# Patient Record
Sex: Male | Born: 2000 | Race: White | Hispanic: No | Marital: Married | Smoking: Never smoker
Health system: Southern US, Community
[De-identification: ages and names within clinical notes are randomized; demographics above are authoritative.]

## PROBLEM LIST (undated history)

## (undated) DIAGNOSIS — G8929 Other chronic pain: Secondary | ICD-10-CM

## (undated) DIAGNOSIS — F419 Anxiety disorder, unspecified: Secondary | ICD-10-CM

## (undated) DIAGNOSIS — F329 Major depressive disorder, single episode, unspecified: Secondary | ICD-10-CM

## (undated) DIAGNOSIS — M545 Low back pain: Secondary | ICD-10-CM

## (undated) HISTORY — DX: Anxiety disorder, unspecified: F41.9

## (undated) HISTORY — PX: TONSILLECTOMY: SUR1361

## (undated) HISTORY — DX: Low back pain: M54.5

## (undated) HISTORY — DX: Other chronic pain: G89.29

## (undated) HISTORY — DX: Major depressive disorder, single episode, unspecified: F32.9

---
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
## 2019-02-09 DIAGNOSIS — Z20828 Contact with and (suspected) exposure to other viral communicable diseases: Secondary | ICD-10-CM | POA: Diagnosis not present

## 2019-02-28 DIAGNOSIS — R0981 Nasal congestion: Secondary | ICD-10-CM | POA: Diagnosis not present

## 2019-02-28 DIAGNOSIS — R05 Cough: Secondary | ICD-10-CM | POA: Diagnosis not present

## 2019-02-28 DIAGNOSIS — U071 COVID-19: Secondary | ICD-10-CM | POA: Diagnosis not present

## 2019-02-28 DIAGNOSIS — R509 Fever, unspecified: Secondary | ICD-10-CM | POA: Diagnosis not present

## 2019-03-16 ENCOUNTER — Ambulatory Visit: Payer: BLUE CROSS/BLUE SHIELD | Admitting: Physician Assistant

## 2019-05-29 ENCOUNTER — Encounter: Payer: Self-pay | Admitting: Physician Assistant

## 2019-05-29 NOTE — Telephone Encounter (Signed)
Raymond Leblanc for letter stating he has been off since last fill date in historical meds. Thanks.

## 2019-07-07 ENCOUNTER — Other Ambulatory Visit: Payer: Self-pay | Admitting: Physician Assistant

## 2019-07-07 DIAGNOSIS — G44019 Episodic cluster headache, not intractable: Secondary | ICD-10-CM

## 2019-08-01 ENCOUNTER — Other Ambulatory Visit: Payer: Self-pay | Admitting: Physician Assistant

## 2019-08-01 ENCOUNTER — Other Ambulatory Visit: Payer: Self-pay

## 2019-08-01 DIAGNOSIS — G44019 Episodic cluster headache, not intractable: Secondary | ICD-10-CM

## 2019-08-01 MED ORDER — TOPIRAMATE 50 MG PO TABS: 50.0000 mg | ORAL_TABLET | Freq: Every day | ORAL | 0 refills | Status: AC

## 2019-08-02 ENCOUNTER — Other Ambulatory Visit: Payer: Self-pay

## 2019-08-02 DIAGNOSIS — G44019 Episodic cluster headache, not intractable: Secondary | ICD-10-CM

## 2019-08-02 MED ORDER — AMITRIPTYLINE HCL 25 MG PO TABS: 25.0000 mg | ORAL_TABLET | Freq: Every day | ORAL | 0 refills | Status: DC

## 2019-08-02 NOTE — Telephone Encounter (Signed)
Refilled 90-day supply medication but it has been almost a year since he has seen Raymond Leblanc.  Would encourage him to come in for visit to establish with new PCP

## 2024-07-02 ENCOUNTER — Encounter: Payer: Self-pay | Admitting: Nurse Practitioner

## 2024-07-02 ENCOUNTER — Ambulatory Visit: Admitting: Nurse Practitioner

## 2024-07-02 VITALS — BP 137/83 | HR 109 | Wt 241.6 lb

## 2024-07-02 DIAGNOSIS — F419 Anxiety disorder, unspecified: Secondary | ICD-10-CM

## 2024-07-02 DIAGNOSIS — Z1329 Encounter for screening for other suspected endocrine disorder: Secondary | ICD-10-CM

## 2024-07-02 DIAGNOSIS — Z13220 Encounter for screening for lipoid disorders: Secondary | ICD-10-CM | POA: Diagnosis not present

## 2024-07-02 MED ORDER — HYDROXYZINE HCL 10 MG PO TABS: 10.0000 mg | ORAL_TABLET | Freq: Three times a day (TID) | ORAL | 0 refills | Status: AC | PRN

## 2024-07-02 MED ORDER — FLUOXETINE HCL 20 MG PO CAPS: 20.0000 mg | ORAL_CAPSULE | Freq: Every day | ORAL | 3 refills | Status: AC

## 2024-07-02 NOTE — Progress Notes (Signed)
 Subjective   Patient ID: Raymond Leblanc, male    DOB: [birth year], 23 y.o.   MRN: [id number]  Chief Complaint  Patient presents with   Anxiety    Has been going on for awhile and would like to start medication if possible    Establish Care    Referring provider: No ref. provider found  Bader Stubblefield is a 23 y.o. male with Past Medical History: No date: Anxiety 07/13/2016: Chronic bilateral low back pain without sciatica 07/13/2016: Depression   HPI  Patient presents today to establish care.  Patient states that he does have a history of anxiety and has been having increased anxiety related to his job recently.  He is in therapy.  We discussed that we will trial Prozac  and hydroxyzine . Denies f/c/s, n/v/d, hemoptysis, PND, leg swelling Denies chest pain or edema       Allergies[1]  Immunization History  Administered Date(s) Administered   DTaP 10/16/1999, 12/18/1999, 02/12/2000, 02/10/2001, 08/12/2004   HIB (PRP-OMP) 10/16/1999, 12/18/1999, 02/12/2000, 08/17/2000   HPV 9-valent 06/26/2014, 09/04/2015   Hepatitis A, Ped/Adol-2 Dose 10/14/2006, 03/07/2008   Hepatitis B, PED/ADOLESCENT 2000[birth year]06-25, 12/18/1999, 05/13/2000   IPV 10/16/1999, 12/18/1999, 05/13/2000, 08/12/2004   Influenza Split 05/24/2006, 06/03/2010   Influenza, Seasonal, Injecte, Preservative Fre 06/26/2014, 09/04/2015   Influenza,inj,Quad PF,6+ Mos 09/15/2018, 05/06/2019   Influenza-Unspecified 06/24/2011, 05/03/2012, 04/09/2020   MMR 08/17/2000, 08/12/2004   Meningococcal B Recombinant 09/04/2015   Meningococcal Conjugate 03/17/2011, 09/04/2015   PFIZER(Purple Top)SARS-COV-2 Vaccination 06/15/2020   Pneumococcal Conjugate-13 10/16/1999, 12/18/1999, 02/12/2000, 08/17/2000   Tdap 03/17/2011   Varicella 10/07/2000, 03/07/2008    Tobacco History: Tobacco Use History[2] Counseling given: Not Answered   Outpatient Encounter Medications as of 07/02/2024  Medication Sig   FLUoxetine  (PROZAC )  20 MG capsule Take 1 capsule (20 mg total) by mouth daily.   hydrOXYzine  (ATARAX ) 10 MG tablet Take 1 tablet (10 mg total) by mouth 3 (three) times daily as needed.   omeprazole (PRILOSEC OTC) 20 MG tablet Take 20 mg by mouth daily.   amitriptyline  (ELAVIL ) 25 MG tablet TAKE 1 TABLET BY MOUTH EVERYDAY AT BEDTIME   topiramate  (TOPAMAX ) 50 MG tablet Take 1 tablet (50 mg total) by mouth at bedtime. **PATIENT NEEDS OFFICE VISIT TO ESTABLISH CARE WITH A NEW PROVIDER FOR ADDITIONAL REFILLS**   No facility-administered encounter medications on file as of 07/02/2024.    Review of Systems  Review of Systems  Constitutional: Negative.   HENT: Negative.    Cardiovascular: Negative.   Gastrointestinal: Negative.   Allergic/Immunologic: Negative.   Neurological: Negative.   Psychiatric/Behavioral: Negative.       Objective:   BP 137/83 (BP Location: Left Arm, Patient Position: Sitting, Cuff Size: Large)   Pulse (!) 109   Wt 241 lb 9.6 oz (109.6 kg)   SpO2 98%   BMI 34.67 kg/m   Wt Readings from Last 5 Encounters:  07/02/24 241 lb 9.6 oz (109.6 kg)  09/15/18 213 lb (96.6 kg) (96%, Z= 1.76)*  08/17/18 216 lb (98 kg) (97%, Z= 1.83)*  01/20/18 217 lb (98.4 kg) (97%, Z= 1.89)*  08/31/16 185 lb 9.6 oz (84.2 kg) (92%, Z= 1.40)*   * Growth percentiles are based on CDC (Boys, 2-20 Years) data.     Physical Exam Vitals and nursing note reviewed.  Constitutional:      General: He is not in acute distress.    Appearance: He is well-developed.  Cardiovascular:     Rate and Rhythm: Normal rate and regular rhythm.  Pulmonary:     Effort: Pulmonary effort is normal.     Breath sounds: Normal breath sounds.  Skin:    General: Skin is warm and dry.  Neurological:     Mental Status: He is alert and oriented to person, place, and time.       Assessment & Plan:   Anxiety -     FLUoxetine  HCl; Take 1 capsule (20 mg total) by mouth daily.  Dispense: 90 capsule; Refill: 3 -     hydrOXYzine   HCl; Take 1 tablet (10 mg total) by mouth 3 (three) times daily as needed.  Dispense: 30 tablet; Refill: 0 -     CBC -     Comprehensive metabolic panel with GFR  Lipid screening -     Lipid panel  Thyroid  disorder screen -     TSH     Return in about 4 weeks (around 07/30/2024) for anxiety.     Bascom GORMAN Borer, NP 07/02/2024     [1] No Known Allergies [2]  Social History Tobacco Use  Smoking Status Never  Smokeless Tobacco Never

## 2024-07-03 LAB — CBC
Hematocrit: 51 % (ref 37.5–51.0)
Hemoglobin: 16.9 g/dL (ref 13.0–17.7)
MCH: 30.6 pg (ref 26.6–33.0)
MCHC: 33.1 g/dL (ref 31.5–35.7)
MCV: 92 fL (ref 79–97)
Platelets: 306 x10E3/uL (ref 150–450)
RBC: 5.52 x10E6/uL (ref 4.14–5.80)
RDW: 12.2 % (ref 11.6–15.4)
WBC: 6.2 x10E3/uL (ref 3.4–10.8)

## 2024-07-03 LAB — COMPREHENSIVE METABOLIC PANEL WITH GFR
ALT: 56 IU/L — ABNORMAL HIGH (ref 0–44)
AST: 25 IU/L (ref 0–40)
Albumin: 4.5 g/dL (ref 4.3–5.2)
Alkaline Phosphatase: 105 IU/L (ref 47–123)
BUN/Creatinine Ratio: 14 (ref 9–20)
BUN: 14 mg/dL (ref 6–20)
Bilirubin Total: 0.3 mg/dL (ref 0.0–1.2)
CO2: 22 mmol/L (ref 20–29)
Calcium: 10 mg/dL (ref 8.7–10.2)
Chloride: 103 mmol/L (ref 96–106)
Creatinine, Ser: 1 mg/dL (ref 0.76–1.27)
Globulin, Total: 2.5 g/dL (ref 1.5–4.5)
Glucose: 98 mg/dL (ref 70–99)
Potassium: 4.4 mmol/L (ref 3.5–5.2)
Sodium: 140 mmol/L (ref 134–144)
Total Protein: 7 g/dL (ref 6.0–8.5)
eGFR: 108 mL/min/1.73 (ref >59)

## 2024-07-03 LAB — LIPID PANEL
Chol/HDL Ratio: 5.7 ratio — ABNORMAL HIGH (ref 0.0–5.0)
Cholesterol, Total: 228 mg/dL — ABNORMAL HIGH (ref 100–199)
HDL: 40 mg/dL (ref >39)
LDL Chol Calc (NIH): 145 mg/dL — ABNORMAL HIGH (ref 0–99)
Triglycerides: 235 mg/dL — ABNORMAL HIGH (ref 0–149)
VLDL Cholesterol Cal: 43 mg/dL — ABNORMAL HIGH (ref 5–40)

## 2024-07-03 LAB — TSH: TSH: 1.27 u[IU]/mL (ref 0.450–4.500)

## 2024-07-04 ENCOUNTER — Ambulatory Visit: Payer: Self-pay | Admitting: Nurse Practitioner

## 2024-08-06 ENCOUNTER — Encounter: Payer: Self-pay | Admitting: Nurse Practitioner

## 2024-08-06 ENCOUNTER — Ambulatory Visit: Payer: Self-pay | Admitting: Nurse Practitioner

## 2024-08-06 VITALS — BP 131/60 | HR 76 | Temp 97.7°F | Wt 234.4 lb

## 2024-08-06 DIAGNOSIS — R7989 Other specified abnormal findings of blood chemistry: Secondary | ICD-10-CM

## 2024-08-06 DIAGNOSIS — Z114 Encounter for screening for human immunodeficiency virus [HIV]: Secondary | ICD-10-CM | POA: Diagnosis not present

## 2024-08-06 DIAGNOSIS — Z23 Encounter for immunization: Secondary | ICD-10-CM

## 2024-08-06 DIAGNOSIS — Z1159 Encounter for screening for other viral diseases: Secondary | ICD-10-CM | POA: Diagnosis not present

## 2024-08-06 DIAGNOSIS — Z13220 Encounter for screening for lipoid disorders: Secondary | ICD-10-CM

## 2024-08-06 DIAGNOSIS — F419 Anxiety disorder, unspecified: Secondary | ICD-10-CM

## 2024-08-06 NOTE — Progress Notes (Signed)
 "  Subjective   Patient ID: Raymond Leblanc, male    DOB: May 10, 2001, 24 y.o.   MRN: [id number]  Chief Complaint  Patient presents with   Anxiety    4 week f/u. Update on medication and follow-up on blood work previously. Fasting for labs.     Referring provider: No ref. provider found  Raymond Leblanc is a 24 y.o. male with Past Medical History: No date: Anxiety 07/13/2016: Chronic bilateral low back pain without sciatica 07/13/2016: Depression  HPI   Patient presents today for follow up.  Patient states that he does have a history of anxiety and has been having increased anxiety related to his job recently.  He is in therapy.  Currently on Prozac  and hydroxyzine . His lipid panel and liver function was elevated at last visit. Will recheck today. Patient has been working on diet. Denies f/c/s, n/v/d, hemoptysis, PND, leg swelling. Denies chest pain or edema.    Allergies[1]  Immunization History  Administered Date(s) Administered   DTaP 10/16/1999, 12/18/1999, 02/12/2000, 02/10/2001, 08/12/2004   HIB (PRP-OMP) 10/16/1999, 12/18/1999, 02/12/2000, 08/17/2000   HPV 9-valent 06/26/2014, 09/04/2015   Hepatitis A, Ped/Adol-2 Dose 10/14/2006, 03/07/2008   Hepatitis B, PED/ADOLESCENT 10-09-1999, 12/18/1999, 05/13/2000   IPV 10/16/1999, 12/18/1999, 05/13/2000, 08/12/2004   Influenza Split 05/24/2006, 06/03/2010   Influenza, Seasonal, Injecte, Preservative Fre 06/26/2014, 09/04/2015, 08/06/2024   Influenza,inj,Quad PF,6+ Mos 09/15/2018, 05/06/2019   Influenza-Unspecified 06/24/2011, 05/03/2012, 04/09/2020   MMR 08/17/2000, 08/12/2004   Meningococcal B Recombinant 09/04/2015   Meningococcal Conjugate 03/17/2011, 09/04/2015   PFIZER(Purple Top)SARS-COV-2 Vaccination 06/15/2020   Pneumococcal Conjugate-13 10/16/1999, 12/18/1999, 02/12/2000, 08/17/2000   Tdap 03/17/2011   Varicella 10/07/2000, 03/07/2008    Tobacco History: Tobacco Use History[2] Counseling given: Not  Answered   Outpatient Encounter Medications as of 08/06/2024  Medication Sig   FLUoxetine  (PROZAC ) 20 MG capsule Take 1 capsule (20 mg total) by mouth daily.   hydrOXYzine  (ATARAX ) 10 MG tablet Take 1 tablet (10 mg total) by mouth 3 (three) times daily as needed.   omeprazole (PRILOSEC OTC) 20 MG tablet Take 20 mg by mouth daily.   amitriptyline  (ELAVIL ) 25 MG tablet TAKE 1 TABLET BY MOUTH EVERYDAY AT BEDTIME (Patient not taking: Reported on 08/06/2024)   topiramate  (TOPAMAX ) 50 MG tablet Take 1 tablet (50 mg total) by mouth at bedtime. **PATIENT NEEDS OFFICE VISIT TO ESTABLISH CARE WITH A NEW PROVIDER FOR ADDITIONAL REFILLS** (Patient not taking: Reported on 08/06/2024)   No facility-administered encounter medications on file as of 08/06/2024.    Review of Systems  Review of Systems  Constitutional: Negative.   HENT: Negative.    Cardiovascular: Negative.   Gastrointestinal: Negative.   Allergic/Immunologic: Negative.   Neurological: Negative.   Psychiatric/Behavioral: Negative.       Objective:   BP 131/60   Pulse 76   Temp 97.7 F (36.5 C) (Temporal)   Wt 234 lb 6.4 oz (106.3 kg)   SpO2 99%   BMI 33.63 kg/m   Wt Readings from Last 5 Encounters:  08/06/24 234 lb 6.4 oz (106.3 kg)  07/02/24 241 lb 9.6 oz (109.6 kg)  09/15/18 213 lb (96.6 kg) (96%, Z= 1.76)*  08/17/18 216 lb (98 kg) (97%, Z= 1.83)*  01/20/18 217 lb (98.4 kg) (97%, Z= 1.89)*   * Growth percentiles are based on CDC (Boys, 2-20 Years) data.     Physical Exam Vitals and nursing note reviewed.  Constitutional:      General: He is not in acute distress.    Appearance:  He is well-developed.  Cardiovascular:     Rate and Rhythm: Normal rate and regular rhythm.  Pulmonary:     Effort: Pulmonary effort is normal.     Breath sounds: Normal breath sounds.  Skin:    General: Skin is warm and dry.  Neurological:     Mental Status: He is alert and oriented to person, place, and time.       Assessment &  Plan:   Anxiety  Need for influenza vaccination -     Flu vaccine trivalent PF, 6mos and older(Flulaval,Afluria,Fluarix,Fluzone)  Elevated liver function tests -     CBC -     Comprehensive metabolic panel with GFR  Lipid screening -     Lipid panel  Need for hepatitis C screening test -     Hepatitis C antibody  Encounter for screening for HIV -     HIV Antibody (routine testing w rflx)     Return in about 3 months (around 11/04/2024) for recheck lipid panel.     Bascom GORMAN Borer, NP 08/15/2024     [1] No Known Allergies [2]  Social History Tobacco Use  Smoking Status Never  Smokeless Tobacco Never   "

## 2024-08-07 LAB — COMPREHENSIVE METABOLIC PANEL WITH GFR
ALT: 53 IU/L — ABNORMAL HIGH (ref 0–44)
AST: 24 IU/L (ref 0–40)
Albumin: 4.6 g/dL (ref 4.3–5.2)
Alkaline Phosphatase: 97 IU/L (ref 47–123)
BUN/Creatinine Ratio: 14 (ref 9–20)
BUN: 13 mg/dL (ref 6–20)
Bilirubin Total: 0.5 mg/dL (ref 0.0–1.2)
CO2: 23 mmol/L (ref 20–29)
Calcium: 9.7 mg/dL (ref 8.7–10.2)
Chloride: 102 mmol/L (ref 96–106)
Creatinine, Ser: 0.9 mg/dL (ref 0.76–1.27)
Globulin, Total: 2.5 g/dL (ref 1.5–4.5)
Glucose: 87 mg/dL (ref 70–99)
Potassium: 4.9 mmol/L (ref 3.5–5.2)
Sodium: 139 mmol/L (ref 134–144)
Total Protein: 7.1 g/dL (ref 6.0–8.5)
eGFR: 122 mL/min/1.73

## 2024-08-07 LAB — HIV ANTIBODY (ROUTINE TESTING W REFLEX): HIV Screen 4th Generation wRfx: NONREACTIVE

## 2024-08-07 LAB — LIPID PANEL
Chol/HDL Ratio: 5.6 ratio — ABNORMAL HIGH (ref 0.0–5.0)
Cholesterol, Total: 212 mg/dL — ABNORMAL HIGH (ref 100–199)
HDL: 38 mg/dL — ABNORMAL LOW
LDL Chol Calc (NIH): 148 mg/dL — ABNORMAL HIGH (ref 0–99)
Triglycerides: 143 mg/dL (ref 0–149)
VLDL Cholesterol Cal: 26 mg/dL (ref 5–40)

## 2024-08-07 LAB — CBC
Hematocrit: 51.5 % — ABNORMAL HIGH (ref 37.5–51.0)
Hemoglobin: 17.2 g/dL (ref 13.0–17.7)
MCH: 31.1 pg (ref 26.6–33.0)
MCHC: 33.4 g/dL (ref 31.5–35.7)
MCV: 93 fL (ref 79–97)
Platelets: 302 x10E3/uL (ref 150–450)
RBC: 5.53 x10E6/uL (ref 4.14–5.80)
RDW: 12.1 % (ref 11.6–15.4)
WBC: 5.3 x10E3/uL (ref 3.4–10.8)

## 2024-08-07 LAB — HEPATITIS C ANTIBODY: Hep C Virus Ab: NONREACTIVE

## 2024-08-08 ENCOUNTER — Ambulatory Visit: Payer: Self-pay | Admitting: Nurse Practitioner

## 2024-11-07 ENCOUNTER — Ambulatory Visit: Payer: Self-pay | Admitting: Nurse Practitioner
